# Patient Record
Sex: Male | Born: 1970 | Race: White | Hispanic: No | Marital: Married | Smoking: Former smoker
Health system: Southern US, Community
[De-identification: ages and names within clinical notes are randomized; demographics above are authoritative.]

## PROBLEM LIST (undated history)

## (undated) DIAGNOSIS — K219 Gastro-esophageal reflux disease without esophagitis: Secondary | ICD-10-CM

## (undated) DIAGNOSIS — I10 Essential (primary) hypertension: Secondary | ICD-10-CM

## (undated) HISTORY — PX: KNEE SURGERY: SHX244

---

## 2000-01-21 ENCOUNTER — Encounter: Admission: RE | Admit: 2000-01-21 | Discharge: 2000-01-21 | Payer: Self-pay | Admitting: Orthopaedic Surgery

## 2000-01-21 ENCOUNTER — Encounter: Payer: Self-pay | Admitting: Orthopaedic Surgery

## 2006-04-07 ENCOUNTER — Encounter: Admission: RE | Admit: 2006-04-07 | Discharge: 2006-04-07 | Payer: Self-pay | Admitting: Orthopaedic Surgery

## 2012-04-27 ENCOUNTER — Other Ambulatory Visit: Payer: Self-pay | Admitting: Family Medicine

## 2012-04-27 DIAGNOSIS — R110 Nausea: Secondary | ICD-10-CM

## 2012-04-27 DIAGNOSIS — R1011 Right upper quadrant pain: Secondary | ICD-10-CM

## 2012-04-28 ENCOUNTER — Ambulatory Visit
Admission: RE | Admit: 2012-04-28 | Discharge: 2012-04-28 | Disposition: A | Discharge status: Home / Self Care | Payer: Commercial Managed Care - PPO | Source: Ambulatory Visit | Attending: Family Medicine | Admitting: Family Medicine

## 2012-04-28 DIAGNOSIS — R110 Nausea: Secondary | ICD-10-CM

## 2012-04-28 DIAGNOSIS — R1011 Right upper quadrant pain: Secondary | ICD-10-CM

## 2012-05-18 ENCOUNTER — Other Ambulatory Visit: Payer: Self-pay | Admitting: Gastroenterology

## 2012-05-18 DIAGNOSIS — R110 Nausea: Secondary | ICD-10-CM

## 2012-05-18 DIAGNOSIS — R1013 Epigastric pain: Secondary | ICD-10-CM

## 2012-05-31 ENCOUNTER — Encounter (HOSPITAL_COMMUNITY)
Admission: RE | Admit: 2012-05-31 | Discharge: 2012-05-31 | Disposition: A | Discharge status: Home / Self Care | Payer: Commercial Managed Care - PPO | Source: Ambulatory Visit | Attending: Gastroenterology | Admitting: Gastroenterology

## 2012-05-31 DIAGNOSIS — R1013 Epigastric pain: Secondary | ICD-10-CM | POA: Insufficient documentation

## 2012-05-31 DIAGNOSIS — R110 Nausea: Secondary | ICD-10-CM

## 2012-05-31 MED ORDER — TECHNETIUM TC 99M MEBROFENIN IV KIT
5.0000 | PACK | Freq: Once | INTRAVENOUS | Status: AC | PRN
  Administered 2012-05-31: 5 via INTRAVENOUS

## 2012-06-01 ENCOUNTER — Other Ambulatory Visit: Payer: Self-pay | Admitting: Gastroenterology

## 2012-06-01 DIAGNOSIS — R109 Unspecified abdominal pain: Secondary | ICD-10-CM

## 2012-06-01 DIAGNOSIS — R110 Nausea: Secondary | ICD-10-CM

## 2012-06-10 ENCOUNTER — Ambulatory Visit
Admission: RE | Admit: 2012-06-10 | Discharge: 2012-06-10 | Disposition: A | Discharge status: Home / Self Care | Payer: Commercial Managed Care - PPO | Source: Ambulatory Visit | Attending: Gastroenterology | Admitting: Gastroenterology

## 2012-06-10 DIAGNOSIS — R109 Unspecified abdominal pain: Secondary | ICD-10-CM

## 2012-06-10 DIAGNOSIS — R110 Nausea: Secondary | ICD-10-CM

## 2012-06-10 MED ORDER — IOHEXOL 300 MG/ML  SOLN
125.0000 mL | Freq: Once | INTRAMUSCULAR | Status: AC | PRN
  Administered 2012-06-10: 125 mL via INTRAVENOUS

## 2013-01-08 IMAGING — NM NM HEPATO W/GB/PHARM/[PERSON_NAME]
3 series · 13 of 13 positions shown · non-contrast
Comparison: Ultrasound abdomen 04/28/2012.

CLINICAL DATA: Epigastric abdominal pain.

NUCLEAR MEDICINE HEPATOBILIARY IMAGING WITH GALLBLADDER EF
TECHNIQUE: Sequential images of the abdomen were obtained [DATE] minutes following intravenous administration of
radiopharmaceutical. After the oral intake of 8 ounces of Ensure
plus, gallbladder ejection fraction was determined.
Radiopharmaceutical:  5.0 mCi 4c-CCm Choletec

[he hepatobiliary · 1 of 1 slices shown (1 of 3)]
[im 1/1]
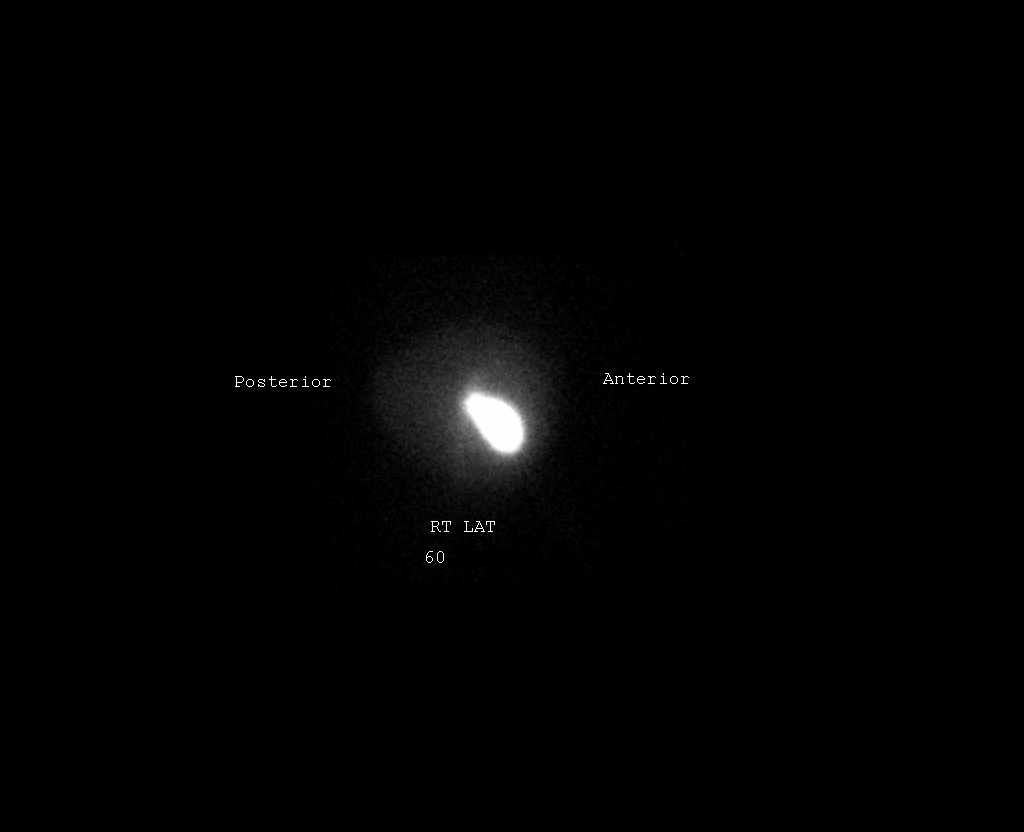

[he hepatobiliary · 3.43mm/px · 6 of 60 frames shown (2 of 3)]
[frame 6/60]
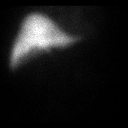
[frame 16/60]
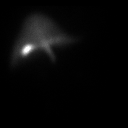
[frame 26/60]
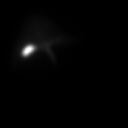
[frame 36/60]
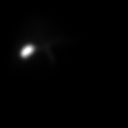
[frame 46/60]
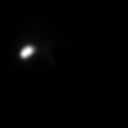
[frame 56/60]
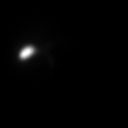

[he hepatobiliary · 3.43mm/px · 6 of 60 frames shown (3 of 3)]
[frame 6/60]
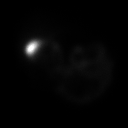
[frame 16/60]
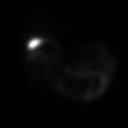
[frame 26/60]
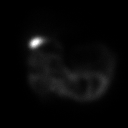
[frame 36/60]
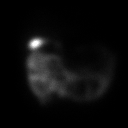
[frame 46/60]
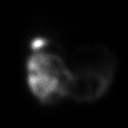
[frame 56/60]
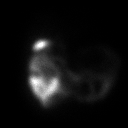

[13 of 13 positions shown; findings below may reference images not displayed]

FINDINGS: There is symmetric uptake in the liver and prompt
excretion into the biliary tree which is visualized by 10 minutes.
The gallbladder is visualized at 85minutes.  Activity is noted in
the small bowel after oral ingestion of Ensure.

The patient received 8 ounces of Ensure plus and a gallbladder
ejection fraction was estimated at 83%.  Normal is greater than
30%.

The patient did not experience symptoms during CCK infusion.
IMPRESSION: Normal biliary patency study and normal gallbladder ejection
fraction.

## 2014-06-29 ENCOUNTER — Encounter: Payer: Self-pay | Admitting: Neurology

## 2014-06-29 ENCOUNTER — Encounter: Payer: Self-pay | Admitting: *Deleted

## 2014-06-30 ENCOUNTER — Encounter: Payer: Self-pay | Admitting: Neurology

## 2014-06-30 ENCOUNTER — Ambulatory Visit (INDEPENDENT_AMBULATORY_CARE_PROVIDER_SITE_OTHER): Payer: Commercial Managed Care - PPO | Admitting: Neurology

## 2014-06-30 VITALS — BP 119/74 | HR 77 | Ht 77.0 in | Wt 290.0 lb

## 2014-06-30 DIAGNOSIS — G509 Disorder of trigeminal nerve, unspecified: Secondary | ICD-10-CM | POA: Insufficient documentation

## 2014-06-30 DIAGNOSIS — R4189 Other symptoms and signs involving cognitive functions and awareness: Secondary | ICD-10-CM | POA: Insufficient documentation

## 2014-06-30 DIAGNOSIS — G35 Multiple sclerosis: Secondary | ICD-10-CM

## 2014-06-30 DIAGNOSIS — M5481 Occipital neuralgia: Secondary | ICD-10-CM

## 2014-06-30 DIAGNOSIS — H539 Unspecified visual disturbance: Secondary | ICD-10-CM | POA: Insufficient documentation

## 2014-06-30 DIAGNOSIS — G500 Trigeminal neuralgia: Secondary | ICD-10-CM

## 2014-06-30 DIAGNOSIS — R4789 Other speech disturbances: Secondary | ICD-10-CM | POA: Insufficient documentation

## 2014-06-30 MED ORDER — CARBAMAZEPINE ER 200 MG PO TB12: 200.0000 mg | ORAL_TABLET | Freq: Two times a day (BID) | ORAL | Status: DC
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
## 2014-06-30 NOTE — Patient Instructions (Signed)
Overall you are doing fairly well but I do want to suggest a few things today:   Remember to drink plenty of fluid, eat healthy meals and do not skip any meals. Try to eat protein with a every meal and eat a healthy snack such as fruit or nuts in between meals. Try to keep a regular sleep-wake schedule and try to exercise daily, particularly in the form of walking, 20-30 minutes a day, if you can.   As far as your medications are concerned, I would like to suggest; tegretol 200mg  twice daily  As far as diagnostic testing: MRI of the brain, MRI of the cervical spine, labs  I would like to see you back in 3 months, sooner if we need to. Please call us with any interim questions, concerns, problems, updates or refill requests.   Please also call us for any test results so we can go over those with you on the phone.  My clinical assistant and will answer any of your questions and relay your messages to me and also relay most of my messages to you.   Our phone number is [phone]. We also have an after hours call service for urgent matters and there is a physician on-call for urgent questions. For any emergencies you know to call 911 or go to the nearest emergency room

## 2014-07-01 ENCOUNTER — Encounter: Payer: Self-pay | Admitting: Neurology

## 2014-07-01 LAB — COMPREHENSIVE METABOLIC PANEL
ALT: 19 IU/L (ref 0–44)
AST: 16 IU/L (ref 0–40)
Albumin/Globulin Ratio: 1.6 (ref 1.1–2.5)
Albumin: 4.5 g/dL (ref 3.5–5.5)
Alkaline Phosphatase: 50 IU/L (ref 39–117)
BUN/Creatinine Ratio: 16 (ref 9–20)
BUN: 19 mg/dL (ref 6–24)
CALCIUM: 9.3 mg/dL (ref 8.7–10.2)
CHLORIDE: 102 mmol/L (ref 97–108)
CO2: 24 mmol/L (ref 18–29)
Creatinine, Ser: 1.19 mg/dL (ref 0.76–1.27)
GFR calc Af Amer: 86 mL/min/{1.73_m2} (ref >59)
GFR calc non Af Amer: 74 mL/min/{1.73_m2} (ref >59)
GLUCOSE: 105 mg/dL — AB (ref 65–99)
Globulin, Total: 2.8 g/dL (ref 1.5–4.5)
POTASSIUM: 4.5 mmol/L (ref 3.5–5.2)
Sodium: 140 mmol/L (ref 134–144)
TOTAL PROTEIN: 7.3 g/dL (ref 6.0–8.5)
Total Bilirubin: 0.6 mg/dL (ref 0.0–1.2)

## 2014-07-01 LAB — C-REACTIVE PROTEIN: CRP: 1 mg/L (ref 0.0–4.9)

## 2014-07-01 LAB — SEDIMENTATION RATE: SED RATE: 8 mm/h (ref 0–15)

## 2014-07-06 ENCOUNTER — Ambulatory Visit
Admission: RE | Admit: 2014-07-06 | Discharge: 2014-07-06 | Disposition: A | Discharge status: Home / Self Care | Payer: Commercial Managed Care - PPO | Source: Ambulatory Visit | Attending: Neurology | Admitting: Neurology

## 2014-07-06 ENCOUNTER — Telehealth: Payer: Self-pay | Admitting: Neurology

## 2014-07-06 DIAGNOSIS — M5481 Occipital neuralgia: Secondary | ICD-10-CM

## 2014-07-06 DIAGNOSIS — G35 Multiple sclerosis: Secondary | ICD-10-CM

## 2014-07-06 DIAGNOSIS — G500 Trigeminal neuralgia: Secondary | ICD-10-CM

## 2014-07-06 DIAGNOSIS — R4789 Other speech disturbances: Secondary | ICD-10-CM

## 2014-07-06 DIAGNOSIS — H539 Unspecified visual disturbance: Secondary | ICD-10-CM

## 2014-07-06 MED ORDER — GADOBENATE DIMEGLUMINE 529 MG/ML IV SOLN: 20.0000 mL | Freq: Once | INTRAVENOUS | Status: AC | PRN

## 2014-07-06 NOTE — Telephone Encounter (Signed)
Patient calling to state that he would like to be called back as soon as his MRI results come in, wants to get everything sorted out before Thanksgiving, please return call and advise.

## 2014-07-07 NOTE — Telephone Encounter (Signed)
Jonathan Lawrence - Would you call and let patient know that it takes a few days for the neuroradiologists to review the hundreds of the images we took of his brain. I will call him as soon as I have the results. If I personally see anything concerning on his images in the meantime, I will definitely give him a call.

## 2014-07-10 ENCOUNTER — Other Ambulatory Visit: Payer: Self-pay | Admitting: Neurology

## 2014-07-10 MED ORDER — CARBAMAZEPINE ER 400 MG PO TB12: 400.0000 mg | ORAL_TABLET | Freq: Two times a day (BID) | ORAL | Status: DC

## 2014-07-10 NOTE — Telephone Encounter (Signed)
Patient is aware that we are waiting on results. Patient stated that he is still having the same issues and they are not getting any better. Patient is aware that once the results are in he would be getting a phone call back.

## 2014-07-10 NOTE — Telephone Encounter (Signed)
Spoke to patient. Will increase the Tegretol xr to 400mg  twice a day. He is still having the pain but tegretol is helping. Discussed his normal MRi of the brain and mild arthritic/degenerative changes on MRI of the neck. He will call back if the increased dose of the Tegretol doesn't work and we can try a different medication. Also discussed occipital and trigeminal nerve blocks but warned insurance may not pay for them however we could try.

## 2014-07-17 NOTE — Telephone Encounter (Signed)
Patient stated still experiencing headaches and Tegretol XR hasn't helped, has experienced nausea, very Lethargic, and knock's him completely out after taking medication.  Please call and advise.

## 2014-07-19 NOTE — Telephone Encounter (Signed)
Spoke to patient and he is aware that we do have his message and some one will give him a call back today. Patient says thank you for following up. Please advise

## 2014-07-19 NOTE — Telephone Encounter (Signed)
Pt calling back stating noone has called him back and he is now having pressure in his head and still feels nausea with headaches.  Please call and advise.

## 2014-07-19 NOTE — Telephone Encounter (Signed)
Jonathan Lawrence - Can you schedule this patient at noon tomorrow please for a follow up appointment? I'm including Shanno so she can creat the slot for me. Thank you

## 2014-07-20 ENCOUNTER — Ambulatory Visit (INDEPENDENT_AMBULATORY_CARE_PROVIDER_SITE_OTHER): Payer: Commercial Managed Care - PPO | Admitting: Neurology

## 2014-07-20 ENCOUNTER — Telehealth: Payer: Self-pay | Admitting: Neurology

## 2014-07-20 ENCOUNTER — Encounter: Payer: Self-pay | Admitting: Neurology

## 2014-07-20 VITALS — BP 133/91 | HR 69 | Ht 77.0 in | Wt 288.0 lb

## 2014-07-20 DIAGNOSIS — R509 Fever, unspecified: Secondary | ICD-10-CM

## 2014-07-20 DIAGNOSIS — M5481 Occipital neuralgia: Secondary | ICD-10-CM

## 2014-07-20 DIAGNOSIS — J029 Acute pharyngitis, unspecified: Secondary | ICD-10-CM

## 2014-07-20 DIAGNOSIS — J020 Streptococcal pharyngitis: Secondary | ICD-10-CM

## 2014-07-20 LAB — COMPREHENSIVE METABOLIC PANEL
ALBUMIN: 4.5 g/dL (ref 3.5–5.5)
ALT: 21 IU/L (ref 0–44)
AST: 18 IU/L (ref 0–40)
Albumin/Globulin Ratio: 1.6 (ref 1.1–2.5)
Alkaline Phosphatase: 78 IU/L (ref 39–117)
BUN/Creatinine Ratio: 11 (ref 9–20)
BUN: 12 mg/dL (ref 6–24)
CO2: 27 mmol/L (ref 18–29)
Calcium: 9 mg/dL (ref 8.7–10.2)
Chloride: 99 mmol/L (ref 96–108)
Creatinine, Ser: 1.11 mg/dL (ref 0.76–1.27)
GFR calc non Af Amer: 81 mL/min/{1.73_m2} (ref >59)
GFR, EST AFRICAN AMERICAN: 94 mL/min/{1.73_m2} (ref >59)
GLOBULIN, TOTAL: 2.9 g/dL (ref 1.5–4.5)
Glucose: 99 mg/dL (ref 65–99)
Potassium: 4.1 mmol/L (ref 3.5–5.2)
Sodium: 137 mmol/L (ref 134–144)
Total Bilirubin: 0.3 mg/dL (ref 0.0–1.2)
Total Protein: 7.4 g/dL (ref 6.0–8.5)

## 2014-07-20 LAB — CBC WITH DIFFERENTIAL
BASOS: 1 %
Basophils Absolute: 0.1 10*3/uL (ref 0.0–0.2)
EOS ABS: 0.2 10*3/uL (ref 0.0–0.4)
EOS: 4 %
HCT: 39.7 % (ref 37.5–51.0)
Hemoglobin: 14.1 g/dL (ref 12.6–17.7)
LYMPHS ABS: 1.1 10*3/uL (ref 0.7–3.1)
Lymphs: 25 %
MCH: 31.4 pg (ref 26.6–33.0)
MCHC: 35.5 g/dL (ref 31.5–35.7)
MCV: 88 fL (ref 79–97)
MONOS ABS: 0.6 10*3/uL (ref 0.1–0.9)
Monocytes: 14 %
Neutrophils Absolute: 2.4 10*3/uL (ref 1.4–7.0)
Neutrophils Relative %: 56 %
PLATELETS: 187 10*3/uL (ref 150–379)
RBC: 4.49 x10E6/uL (ref 4.14–5.80)
RDW: 14 % (ref 12.3–15.4)
WBC: 4.2 10*3/uL (ref 3.4–10.8)

## 2014-07-20 MED ORDER — AZITHROMYCIN 250 MG PO TABS: ORAL_TABLET | ORAL | Status: DC

## 2014-07-20 MED ORDER — GABAPENTIN 300 MG PO CAPS: 600.0000 mg | ORAL_CAPSULE | Freq: Three times a day (TID) | ORAL | Status: DC

## 2014-07-20 NOTE — Telephone Encounter (Signed)
Patient was not feeling well at appointment today, fever, sore throat. CMP/CBC were unremarkable. WBCs within normal limits.

## 2014-07-20 NOTE — Telephone Encounter (Signed)
Patient calling for blood work results.  Please call and advise. °

## 2014-07-23 DIAGNOSIS — M5481 Occipital neuralgia: Secondary | ICD-10-CM | POA: Insufficient documentation
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
## 2014-07-23 NOTE — Patient Instructions (Signed)
What to expect afterwards?  Immediately after the injection, the back of your head may feel warm and numb. You may also experience reduction in the pain. The local anaesthetic wears off in a few hours and the steroid usually takes 3-7 days to take effect.  The pain relief is variable and can last from a few days to several months. Some patients do not experience any pain relief. Hence it is difficult to predict the outcome of the injection treatment in a particular patient.  There may be some discomfort at the injection site for a couple of days after treatment, however, this should settle quite quickly. We advise you to take things easy for the rest of the day. Continue taking your pain medication as advised by your consultant or until you feel benefit from the treatment.  What are the side effects / complications?  Common  Soreness / bruising at the injection site.  Temporary increase (up to 7 days) in pain following procedure.  Rare  Bleeding  Infection at the injection site  Allergic reaction  New pain  Worsening pain

## 2014-07-24 ENCOUNTER — Encounter: Payer: Self-pay | Admitting: Neurology

## 2014-10-04 ENCOUNTER — Telehealth: Payer: Self-pay | Admitting: Neurology

## 2014-10-04 NOTE — Telephone Encounter (Signed)
Dr. Lucia Gaskins already called and talked with patient.

## 2014-10-04 NOTE — Telephone Encounter (Signed)
Pt is calling stating he wants some follow up he is going to be traveling and going diving and wants to make sure there will be no problems with his current situation.  Please call and advise.  Would like to talk with Dr. Lucia Gaskins.

## 2014-10-04 NOTE — Telephone Encounter (Signed)
Spoke to this very nice patient, he is doing better. He is going on vacation, going scuba diving. I don't see any issues exceot possibly the scuba mask causing discomfort in the occipital area. Recommended he take neurontin while on vacation and cal for any exacerbation and we can discuss otp.

## 2014-10-26 ENCOUNTER — Emergency Department (HOSPITAL_COMMUNITY)
Admission: EM | Admit: 2014-10-26 | Discharge: 2014-10-26 | Disposition: A | Discharge status: Home / Self Care | Payer: Commercial Managed Care - PPO | Attending: Emergency Medicine | Admitting: Emergency Medicine

## 2014-10-26 ENCOUNTER — Emergency Department (HOSPITAL_COMMUNITY): Payer: Commercial Managed Care - PPO

## 2014-10-26 ENCOUNTER — Encounter (HOSPITAL_COMMUNITY): Payer: Self-pay

## 2014-10-26 DIAGNOSIS — R0602 Shortness of breath: Secondary | ICD-10-CM | POA: Insufficient documentation

## 2014-10-26 DIAGNOSIS — K219 Gastro-esophageal reflux disease without esophagitis: Secondary | ICD-10-CM | POA: Diagnosis not present

## 2014-10-26 DIAGNOSIS — Z87891 Personal history of nicotine dependence: Secondary | ICD-10-CM | POA: Diagnosis not present

## 2014-10-26 DIAGNOSIS — Z880 Allergy status to penicillin: Secondary | ICD-10-CM | POA: Insufficient documentation

## 2014-10-26 DIAGNOSIS — R0789 Other chest pain: Secondary | ICD-10-CM | POA: Diagnosis not present

## 2014-10-26 DIAGNOSIS — I10 Essential (primary) hypertension: Secondary | ICD-10-CM | POA: Insufficient documentation

## 2014-10-26 DIAGNOSIS — R079 Chest pain, unspecified: Secondary | ICD-10-CM | POA: Diagnosis present

## 2014-10-26 HISTORY — DX: Gastro-esophageal reflux disease without esophagitis: K21.9

## 2014-10-26 HISTORY — DX: Essential (primary) hypertension: I10

## 2014-10-26 LAB — I-STAT TROPONIN, ED
TROPONIN I, POC: 0 ng/mL (ref 0.00–0.08)
Troponin i, poc: 0 ng/mL (ref 0.00–0.08)

## 2014-10-26 LAB — CBC
HCT: 41.7 % (ref 39.0–52.0)
Hemoglobin: 14.5 g/dL (ref 13.0–17.0)
MCH: 32 pg (ref 26.0–34.0)
MCHC: 34.8 g/dL (ref 30.0–36.0)
MCV: 92.1 fL (ref 78.0–100.0)
Platelets: 279 10*3/uL (ref 150–400)
RBC: 4.53 MIL/uL (ref 4.22–5.81)
RDW: 13.3 % (ref 11.5–15.5)
WBC: 5.2 10*3/uL (ref 4.0–10.5)

## 2014-10-26 LAB — COMPREHENSIVE METABOLIC PANEL
ALBUMIN: 3.8 g/dL (ref 3.5–5.2)
ALT: 22 U/L (ref 0–53)
ANION GAP: 6 (ref 5–15)
AST: 23 U/L (ref 0–37)
Alkaline Phosphatase: 42 U/L (ref 39–117)
BUN: 10 mg/dL (ref 6–23)
CALCIUM: 9 mg/dL (ref 8.4–10.5)
CHLORIDE: 105 mmol/L (ref 96–112)
CO2: 28 mmol/L (ref 19–32)
Creatinine, Ser: 1.03 mg/dL (ref 0.50–1.35)
GFR calc Af Amer: >90 mL/min (ref >90)
GFR calc non Af Amer: 87 mL/min — ABNORMAL LOW (ref >90)
Glucose, Bld: 98 mg/dL (ref 70–99)
Potassium: 4.1 mmol/L (ref 3.5–5.1)
Sodium: 139 mmol/L (ref 135–145)
Total Bilirubin: 1 mg/dL (ref 0.3–1.2)
Total Protein: 7 g/dL (ref 6.0–8.3)

## 2014-10-26 LAB — BRAIN NATRIURETIC PEPTIDE: B NATRIURETIC PEPTIDE 5: 26.5 pg/mL (ref 0.0–100.0)

## 2014-10-26 LAB — D-DIMER, QUANTITATIVE (NOT AT ARMC)

## 2014-10-26 MED ORDER — CYCLOBENZAPRINE HCL 10 MG PO TABS: 10.0000 mg | ORAL_TABLET | Freq: Two times a day (BID) | ORAL | Status: DC | PRN

## 2014-10-26 NOTE — ED Notes (Signed)
NAD noted. Pt denies any other complaints. Pt refused wheelchair on discharge. Pt ambulatory on discharge.

## 2014-10-26 NOTE — ED Notes (Signed)
Jonathan Lawrence Charity fundraiser at bedside

## 2014-10-26 NOTE — Discharge Instructions (Signed)
Take Flexeril as needed for pain. Refer to attached documents for more information. Follow up with your doctor for further evaluation. Return to the ED with worsening or concerning symptoms.

## 2014-10-26 NOTE — ED Provider Notes (Signed)
CSN: [id number]     Arrival date & time 10/26/14  7829 History   First MD Initiated Contact with Patient 10/26/14 [phone]     Chief Complaint  Patient presents with  . Chest Pain  . Shortness of Breath     (Consider location/radiation/quality/duration/timing/severity/associated sxs/prior Treatment) Patient is a 44 y.o. male presenting with chest pain. The history is provided by the patient. No language interpreter was used.  Chest Pain Pain location:  R chest Pain quality: sharp   Pain radiates to:  Does not radiate Pain radiates to the back: no   Pain severity:  Severe Onset quality:  Sudden Duration:  6 hours Timing:  Constant Progression:  Improving Chronicity:  New Context: breathing and at rest   Context: no drug use, not eating, no intercourse, no movement, not raising an arm, no stress and no trauma   Relieved by:  Nothing Worsened by:  Nothing tried Ineffective treatments:  None tried Associated symptoms: shortness of breath   Associated symptoms: no abdominal pain, no dizziness, no dysphagia, no fatigue, no fever, no nausea, no palpitations, not vomiting and no weakness   Shortness of breath:    Severity:  Moderate   Onset quality:  Gradual   Duration:  6 hours   Timing:  Constant   Progression:  Improving Risk factors: male sex   Risk factors: no aortic disease, no birth control, no coronary artery disease, no diabetes mellitus, no Ehlers-Danlos syndrome, no high cholesterol, no hypertension, no immobilization, no Marfan's syndrome, not obese, not pregnant, no prior DVT/PE, no smoking and no surgery     Past Medical History  Diagnosis Date  . Hypertension   . GERD (gastroesophageal reflux disease)    Past Surgical History  Procedure Laterality Date  . Knee surgery     Family History  Problem Relation Age of Onset  . Hypertension Father   . Hearing loss Father   . Heart disease    . Migraines Neg Hx    History  Substance Use Topics  . Smoking status:  Former Games developer  . Smokeless tobacco: Never Used  . Alcohol Use: 0.0 oz/week    0 Standard drinks or equivalent per week     Comment: 2 drinks daily     Review of Systems  Constitutional: Negative for fever, chills and fatigue.  HENT: Negative for trouble swallowing.   Eyes: Negative for visual disturbance.  Respiratory: Positive for shortness of breath.   Cardiovascular: Positive for chest pain. Negative for palpitations.  Gastrointestinal: Negative for nausea, vomiting, abdominal pain and diarrhea.  Genitourinary: Negative for dysuria and difficulty urinating.  Musculoskeletal: Negative for arthralgias and neck pain.  Skin: Negative for color change.  Neurological: Negative for dizziness and weakness.  Psychiatric/Behavioral: Negative for dysphoric mood.      Allergies  Penicillins; Hydrocodone; and Oxycodone  Home Medications   Prior to Admission medications   Medication Sig Start Date End Date Taking? Authorizing Provider  esomeprazole (NEXIUM) 40 MG capsule Take 40 mg by mouth daily at 12 noon.   Yes Historical Provider, MD  gabapentin (NEURONTIN) 300 MG capsule Take 2 capsules (600 mg total) by mouth 3 (three) times daily. Patient taking differently: Take 600 mg by mouth daily as needed (neuralgia).  07/20/14  Yes Anson Fret, MD  valACYclovir (VALTREX) 500 MG tablet Take 500 mg by mouth daily as needed (cold sores).  10/24/14  Yes Historical Provider, MD  azithromycin (ZITHROMAX Z-PAK) 250 MG tablet Day 1: take 2 Days  2-5: take one daily Patient not taking: Reported on 10/26/2014 07/20/14   Anson Fret, MD   BP 126/78 mmHg  Temp(Src) 97.6 F (36.4 C) (Oral)  Resp 12  Ht 6\' 4"  (1.93 m)  Wt 270 lb (122.471 kg)  BMI 32.88 kg/m2 Physical Exam  Constitutional: He is oriented to person, place, and time. He appears well-developed and well-nourished. No distress.  HENT:  Head: Normocephalic and atraumatic.  Eyes: Conjunctivae and EOM are normal.  Neck: Normal range of  motion.  Cardiovascular: Normal rate and regular rhythm.  Exam reveals no gallop and no friction rub.   No murmur heard. Pulmonary/Chest: Effort normal and breath sounds normal. He has no wheezes. He has no rales. He exhibits no tenderness.  Abdominal: Soft. He exhibits no distension. There is no tenderness. There is no rebound.  Musculoskeletal: Normal range of motion.  Neurological: He is alert and oriented to person, place, and time. Coordination normal.  Speech is goal-oriented. Moves limbs without ataxia.   Skin: Skin is warm and dry.  Psychiatric: He has a normal mood and affect. His behavior is normal.  Nursing note and vitals reviewed.   ED Course  Procedures (including critical care time) Labs Review Labs Reviewed  COMPREHENSIVE METABOLIC PANEL - Abnormal; Notable for the following:    GFR calc non Af Amer 87 (*)    All other components within normal limits  CBC  BRAIN NATRIURETIC PEPTIDE  D-DIMER, QUANTITATIVE  I-STAT TROPOININ, ED  Rosezena Sensor, ED    Imaging Review Dg Chest Port 1 View  10/26/2014   CLINICAL DATA:  Chest pain intermittently for 2 weeks. Acute exacerbation of chest pain today. Shortness of breath. Initial encounter.  EXAM: PORTABLE CHEST - 1 VIEW  COMPARISON:  None.  FINDINGS: Cardiopericardial silhouette within normal limits. Mediastinal contours normal. Trachea midline. No airspace disease or effusion. Monitoring leads project over the chest.  IMPRESSION: No active disease.   Electronically Signed   By: Andreas Newport M.D.   On: 10/26/2014 07:20     EKG Interpretation   Date/Time:  Thursday October 26 2014 07:04:37 EST Ventricular Rate:  68 PR Interval:  170 QRS Duration: 92 QT Interval:  401 QTC Calculation: 426 R Axis:   17 Text Interpretation:  Sinus rhythm Normal ECG No old tracing to compare  Confirmed by OTTER  MD, OLGA ([id number]) on 10/26/2014 7:08:51 AM      MDM   Final diagnoses:  Atypical chest pain    8:52 AM Labs pending.  Chest xray shows no acute changes. EKG shows no acute changes. Vitals stable and patient afebrile.   12:33 PM Troponin negative. Patient having atypical chest pain. No concern for ischemic etiology. Patient will be discharged with out further evaluation. Patient instructed to follow up with PCP. Vitals stable and patient afebrile.   Emilia Beck, PA-C 10/26/14 1618  Derwood Kaplan, MD 10/28/14 1644

## 2014-10-26 NOTE — ED Notes (Signed)
X-ray at bedside

## 2016-09-29 ENCOUNTER — Encounter (INDEPENDENT_AMBULATORY_CARE_PROVIDER_SITE_OTHER): Payer: Self-pay | Admitting: Orthopaedic Surgery

## 2016-09-29 ENCOUNTER — Ambulatory Visit (INDEPENDENT_AMBULATORY_CARE_PROVIDER_SITE_OTHER): Payer: Commercial Managed Care - PPO | Admitting: Orthopaedic Surgery

## 2016-09-29 ENCOUNTER — Ambulatory Visit (INDEPENDENT_AMBULATORY_CARE_PROVIDER_SITE_OTHER): Payer: Commercial Managed Care - PPO

## 2016-09-29 VITALS — BP 124/88 | HR 84 | Resp 14 | Ht 76.0 in | Wt 270.0 lb

## 2016-09-29 DIAGNOSIS — M79672 Pain in left foot: Secondary | ICD-10-CM

## 2016-09-29 DIAGNOSIS — M79671 Pain in right foot: Secondary | ICD-10-CM

## 2016-09-29 MED ORDER — LIDOCAINE HCL 1 % IJ SOLN
1.0000 mL | INTRAMUSCULAR | Status: AC | PRN
  Administered 2016-09-29: 1 mL

## 2016-09-29 MED ORDER — BUPIVACAINE HCL 0.5 % IJ SOLN
1.0000 mL | INTRAMUSCULAR | Status: AC | PRN
  Administered 2016-09-29: 1 mL

## 2016-09-29 MED ORDER — METHYLPREDNISOLONE ACETATE 40 MG/ML IJ SUSP
40.0000 mg | INTRAMUSCULAR | Status: AC | PRN
  Administered 2016-09-29: 40 mg

## 2016-09-29 NOTE — Progress Notes (Signed)
   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   Orders:  No orders of the defined types were placed in this encounter.  No orders of the defined types were placed in this encounter.     Procedures: Foot Inj Date/Time: 09/29/2016 4:36 PM Performed by: Valeria Batman Authorized by: Valeria Batman   Consent Given by:  Patient Site marked: the procedure site was marked   Indications:  Pain Condition comment:  Mid foot arthritis Approach:  Dorsal Medications:  1 mL lidocaine 1 %; 1 mL bupivacaine 0.5 %; 40 mg methylPREDNISolone acetate 40 MG/ML Patient Tolerance:  Patient tolerated the procedure well with no immediate complications     Clinical Data: No additional findings.   Subjective: No chief complaint on file. Jonathan Lawrence relates chronic problems with both of his feet more so on the right than the left. He is aware that he has pronated feet and has been to the good foot store for inserts. He is not sure they've made much of a difference. He is on his feet for many hours during the day and is having more and more trouble again more on the right than the left .Marland Kitchen No significant swelling.  denies any problem with the skin  HPI  Review of Systems   Objective: Vital Signs: There were no vitals taken for this visit.  Physical Exam  Ortho Exam emanation of both feet reveal significant pronation bilaterally more so on the right than the left. There are large areas of osteophyte formation at the first metatarsal  navicular joint on the right and minimally on the left. Good pulses skin intact neurologically intact. No ankle pain.  Specialty Comments:  No specialty comments available.  Imaging: No results found.   PMFS History: Patient Active Problem List   Diagnosis Date Noted  . Occipital neuralgia of right side 07/23/2014  . 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 Word finding difficulty 06/30/2014  . Vision changes 06/30/2014  . Cognitive changes 06/30/2014   Past Medical History:  Diagnosis Date  . GERD (gastroesophageal reflux disease)   . Hypertension     Family History  Problem Relation Age of Onset  . Hypertension Father   . Hearing loss Father   . Heart disease    . Migraines Neg Hx     Past Surgical History:  Procedure Laterality Date  . KNEE SURGERY     Social History   Occupational History  . Not on file.   Social History Main Topics  . Smoking status: Former Games developer  . Smokeless tobacco: Never Used  . Alcohol use 0.0 oz/week     Comment: 2 drinks daily   . Drug use: No  . Sexual activity: Not on file

## 2019-05-24 ENCOUNTER — Encounter (HOSPITAL_BASED_OUTPATIENT_CLINIC_OR_DEPARTMENT_OTHER): Payer: Self-pay | Admitting: Emergency Medicine

## 2019-05-24 ENCOUNTER — Emergency Department (HOSPITAL_BASED_OUTPATIENT_CLINIC_OR_DEPARTMENT_OTHER): Payer: Commercial Managed Care - PPO

## 2019-05-24 ENCOUNTER — Other Ambulatory Visit: Payer: Self-pay

## 2019-05-24 ENCOUNTER — Emergency Department (HOSPITAL_BASED_OUTPATIENT_CLINIC_OR_DEPARTMENT_OTHER)
Admission: EM | Admit: 2019-05-24 | Discharge: 2019-05-24 | Disposition: A | Discharge status: Home / Self Care | Payer: Commercial Managed Care - PPO | Attending: Emergency Medicine | Admitting: Emergency Medicine

## 2019-05-24 DIAGNOSIS — Z87891 Personal history of nicotine dependence: Secondary | ICD-10-CM | POA: Insufficient documentation

## 2019-05-24 DIAGNOSIS — R1011 Right upper quadrant pain: Secondary | ICD-10-CM | POA: Diagnosis not present

## 2019-05-24 DIAGNOSIS — Z880 Allergy status to penicillin: Secondary | ICD-10-CM | POA: Diagnosis not present

## 2019-05-24 DIAGNOSIS — Z885 Allergy status to narcotic agent status: Secondary | ICD-10-CM | POA: Insufficient documentation

## 2019-05-24 DIAGNOSIS — I10 Essential (primary) hypertension: Secondary | ICD-10-CM | POA: Insufficient documentation

## 2019-05-24 DIAGNOSIS — Z79899 Other long term (current) drug therapy: Secondary | ICD-10-CM | POA: Insufficient documentation

## 2019-05-24 LAB — COMPREHENSIVE METABOLIC PANEL
ALT: 18 U/L (ref 0–44)
AST: 20 U/L (ref 15–41)
Albumin: 4.1 g/dL (ref 3.5–5.0)
Alkaline Phosphatase: 56 U/L (ref 38–126)
Anion gap: 9 (ref 5–15)
BUN: 11 mg/dL (ref 6–20)
CO2: 24 mmol/L (ref 22–32)
Calcium: 8.8 mg/dL — ABNORMAL LOW (ref 8.9–10.3)
Chloride: 101 mmol/L (ref 98–111)
Creatinine, Ser: 1.17 mg/dL (ref 0.61–1.24)
GFR calc Af Amer: >60 mL/min (ref >60)
GFR calc non Af Amer: >60 mL/min (ref >60)
Glucose, Bld: 98 mg/dL (ref 70–99)
Potassium: 3.8 mmol/L (ref 3.5–5.1)
Sodium: 134 mmol/L — ABNORMAL LOW (ref 135–145)
Total Bilirubin: 0.9 mg/dL (ref 0.3–1.2)
Total Protein: 7.4 g/dL (ref 6.5–8.1)

## 2019-05-24 LAB — CBC
HCT: 42.2 % (ref 39.0–52.0)
Hemoglobin: 14.1 g/dL (ref 13.0–17.0)
MCH: 31 pg (ref 26.0–34.0)
MCHC: 33.4 g/dL (ref 30.0–36.0)
MCV: 92.7 fL (ref 80.0–100.0)
Platelets: 271 10*3/uL (ref 150–400)
RBC: 4.55 MIL/uL (ref 4.22–5.81)
RDW: 12.9 % (ref 11.5–15.5)
WBC: 5.4 10*3/uL (ref 4.0–10.5)
nRBC: 0 % (ref 0.0–0.2)

## 2019-05-24 LAB — URINALYSIS, ROUTINE W REFLEX MICROSCOPIC
Bilirubin Urine: NEGATIVE
Glucose, UA: NEGATIVE mg/dL
Hgb urine dipstick: NEGATIVE
Ketones, ur: NEGATIVE mg/dL
Leukocytes,Ua: NEGATIVE
Nitrite: NEGATIVE
Protein, ur: NEGATIVE mg/dL
Specific Gravity, Urine: <1.005 — ABNORMAL LOW (ref 1.005–1.030)
pH: 6.5 (ref 5.0–8.0)

## 2019-05-24 LAB — LIPASE, BLOOD: Lipase: 28 U/L (ref 11–51)

## 2019-05-24 MED ORDER — SODIUM CHLORIDE 0.9 % IV SOLN: 1000.0000 mL | INTRAVENOUS | Status: DC

## 2019-05-24 MED ORDER — DICYCLOMINE HCL 20 MG PO TABS: 20.0000 mg | ORAL_TABLET | Freq: Three times a day (TID) | ORAL | 0 refills | Status: DC | PRN

## 2019-05-24 MED ORDER — ONDANSETRON HCL 4 MG/2ML IJ SOLN
4.0000 mg | Freq: Once | INTRAMUSCULAR | Status: AC
  Administered 2019-05-24: 4 mg via INTRAVENOUS
  Filled 2019-05-24: qty 2

## 2019-05-24 MED ORDER — SODIUM CHLORIDE 0.9 % IV BOLUS (SEPSIS)
1000.0000 mL | Freq: Once | INTRAVENOUS | Status: AC
  Administered 2019-05-24: 16:00:00 1000 mL via INTRAVENOUS

## 2019-05-24 MED ORDER — FENTANYL CITRATE (PF) 100 MCG/2ML IJ SOLN
75.0000 ug | Freq: Once | INTRAMUSCULAR | Status: AC
  Administered 2019-05-24: 75 ug via INTRAVENOUS
  Filled 2019-05-24: qty 2

## 2019-05-24 NOTE — ED Triage Notes (Signed)
RUQ pain since last night. Hx of gallbladder issues.

## 2019-05-24 NOTE — Discharge Instructions (Signed)
Follow-up with a GI doctor for further evaluation, return as needed for worsening symptoms

## 2019-05-24 NOTE — ED Notes (Signed)
ED Provider at bedside. 

## 2019-05-24 NOTE — ED Notes (Signed)
Pt. Reports he ate salad for lunch that poss. Brought this on that is when this started.  Pt. Then had burger for lunch today making things much worse he reports.    Pt. Reports he is in so much pain.

## 2019-05-24 NOTE — ED Provider Notes (Signed)
[facility] EMERGENCY DEPARTMENT Provider Note   CSN: [id number] Arrival date & time: 05/24/19  1428     History   Chief Complaint Chief Complaint  Patient presents with  . Abdominal Pain    HPI Jonathan Lawrence is a 48 y.o. male.     The history is provided by the patient.  Abdominal Pain Pain location:  RUQ Pain quality: aching   Pain quality comment:  Also feels like it is swollen in the RUQ Pain radiates to:  Chest Pain severity:  Severe Onset quality:  Gradual Duration:  1 day (has had some episodes over the last 3-4 months) Timing:  Constant Progression:  Worsening Chronicity:  Recurrent Context comment:  Starting after eating last night, over the last few months certain meals will make it worse Worsened by:  Position changes (eating left over hamburger for lunch) Associated symptoms: no fever, no nausea and no vomiting     Past Medical History:  Diagnosis Date  . GERD (gastroesophageal reflux disease)   . Hypertension     Patient Active Problem List   Diagnosis Date Noted  . Occipital neuralgia of right side 07/23/2014  . 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 Word finding difficulty 06/30/2014  . Vision changes 06/30/2014  . Cognitive changes 06/30/2014    Past Surgical History:  Procedure Laterality Date  . KNEE SURGERY          Home Medications    Prior to Admission medications   Medication Sig Start Date End Date Taking? Authorizing Provider  azithromycin (ZITHROMAX Z-PAK) 250 MG tablet Day 1: take 2 Days 2-5: take one daily Patient not taking: Reported on 10/26/2014 07/20/14   Anson Fret, MD  cyclobenzaprine (FLEXERIL) 10 MG tablet Take 1 tablet (10 mg total) by mouth 2 (two) times daily as needed for muscle spasms. Patient not taking: Reported on 09/29/2016 10/26/14   Emilia Beck, PA-C  dicyclomine (BENTYL) 20 MG tablet Take 1 tablet (20 mg total) by mouth 3 (three) times daily as needed for spasms. 05/24/19   Linwood Dibbles, MD  esomeprazole (NEXIUM) 40 MG capsule Take 40 mg by mouth daily at 12 noon.    [provider]  gabapentin (NEURONTIN) 300 MG capsule Take 2 capsules (600 mg total) by mouth 3 (three) times daily. Patient not taking: Reported on 09/29/2016 07/20/14   Anson Fret, MD  valACYclovir (VALTREX) 500 MG tablet Take 500 mg by mouth daily as needed (cold sores).  10/24/14   [provider]    Family History Family History  Problem Relation Age of Onset  . Hypertension Father   . Hearing loss Father   . Heart disease Other   . Migraines Neg Hx     Social History Social History   Tobacco Use  . Smoking status: Former Games developer  . Smokeless tobacco: Never Used  Substance Use Topics  . Alcohol use: Yes    Alcohol/week: 0.0 standard drinks    Comment: 2 drinks daily   . Drug use: No     Allergies   Penicillins, Hydrocodone, and Oxycodone   Review of Systems Review of Systems  Constitutional: Negative for fever.  Gastrointestinal: Positive for abdominal pain. Negative for nausea and vomiting.  All other systems reviewed and are negative.    Physical Exam Updated Vital Signs BP (!) 137/91 (BP Location: Right Arm)   Pulse 66   Temp 98.1 F (36.7 C) (Oral)   Resp 18   Ht 1.956  m (6\' 5" )   Wt 131.5 kg   SpO2 98%   BMI 34.39 kg/m   Physical Exam Vitals signs and nursing note reviewed.  Constitutional:      General: He is not in acute distress.    Appearance: He is well-developed.  HENT:     Head: Normocephalic and atraumatic.     Right Ear: External ear normal.     Left Ear: External ear normal.  Eyes:     General: No scleral icterus.       Right eye: No discharge.        Left eye: No discharge.     Conjunctiva/sclera: Conjunctivae normal.  Neck:     Musculoskeletal: Neck supple.     Trachea: No tracheal deviation.  Cardiovascular:     Rate and Rhythm: Normal rate and regular rhythm.  Pulmonary:     Effort: Pulmonary effort is normal. No  respiratory distress.     Breath sounds: Normal breath sounds. No stridor. No wheezing or rales.  Abdominal:     General: Bowel sounds are normal. There is no distension.     Palpations: Abdomen is soft.     Tenderness: There is abdominal tenderness in the right upper quadrant. There is no guarding or rebound.  Musculoskeletal:        General: No tenderness.  Skin:    General: Skin is warm and dry.     Findings: No rash.  Neurological:     Mental Status: He is alert.     Cranial Nerves: No cranial nerve deficit (no facial droop, extraocular movements intact, no slurred speech).     Sensory: No sensory deficit.     Motor: No abnormal muscle tone or seizure activity.     Coordination: Coordination normal.      ED Treatments / Results  Labs (all labs ordered are listed, but only abnormal results are displayed) Labs Reviewed  COMPREHENSIVE METABOLIC PANEL - Abnormal; Notable for the following components:      Result Value   Sodium 134 (*)    Calcium 8.8 (*)    All other components within normal limits  URINALYSIS, ROUTINE W REFLEX MICROSCOPIC - Abnormal; Notable for the following components:   Specific Gravity, Urine <1.005 (*)    All other components within normal limits  LIPASE, BLOOD  CBC    EKG None  Radiology US Abdomen Complete  Result Date: 05/24/2019 CLINICAL DATA:  Severe right upper quadrant pain for 1 day EXAM: ABDOMEN ULTRASOUND COMPLETE COMPARISON:  April 28, 2012 FINDINGS: Gallbladder: Gallbladder is not well distended. No sonographic Eulah Pont sign is reported by the ultrasonographer but the patient has had pain medications. No pericholecystic fluid is identified. No gallstones noted. Common bile duct: Diameter: 2.7 mm Liver: No focal lesion identified. Within normal limits in parenchymal echogenicity. Portal vein is patent on color Doppler imaging with normal direction of blood flow towards the liver. IVC: Not well seen due to overlying bowel gas. Pancreas: Not  well seen due to overlying bowel gas. Spleen: Size and appearance within normal limits. Right Kidney: Length: 12.4 cm. Echogenicity within normal limits. No mass or hydronephrosis visualized. Left Kidney: Length: 12 cm. Echogenicity within normal limits. No mass or hydronephrosis visualized. Abdominal aorta: No aneurysm visualized. Other findings: None. IMPRESSION: No acute abnormality. Electronically Signed   By: Sherian Rein M.D.   On: 05/24/2019 17:56    Procedures Procedures (including critical care time)  Medications Ordered in ED Medications  sodium chloride 0.9 %  bolus 1,000 mL (1,000 mLs Intravenous New Bag/Given 05/24/19 1625)    Followed by  0.9 %  sodium chloride infusion (has no administration in time range)  fentaNYL (SUBLIMAZE) injection 75 mcg (75 mcg Intravenous Given 05/24/19 1627)  ondansetron (ZOFRAN) injection 4 mg (4 mg Intravenous Given 05/24/19 1625)     Initial Impression / Assessment and Plan / ED Course  I have reviewed the triage vital signs and the nursing notes.  Pertinent labs & imaging results that were available during my care of the patient were reviewed by me and considered in my medical decision making (see chart for details).   Patient presented to the emergency room for evaluation of right upper quadrant abdominal pain.  Patient has noticed these episodes with certain foods.  Symptoms were concerning for the possibility of biliary colic.  However his laboratory tests and ultrasound are normal.  No evidence of aneurysm noted on ultrasound.  Labs do not suggest pancreatitis.  White blood cell count is not elevated argues against any acute infectious etiology.  At this time no signs of any acute emergency medical condition.  I will have him try Bentyl in addition to his antacids that he takes regularly.  I think the patient would benefit from outpatient GI follow-up.  Final Clinical Impressions(s) / ED Diagnoses   Final diagnoses:  Right upper quadrant  abdominal pain    ED Discharge Orders         Ordered    dicyclomine (BENTYL) 20 MG tablet  3 times daily PRN     05/24/19 1829           Dorie Rank, MD 05/24/19 [phone]

## 2019-05-26 ENCOUNTER — Other Ambulatory Visit: Payer: Self-pay | Admitting: Gastroenterology

## 2019-05-26 DIAGNOSIS — R1011 Right upper quadrant pain: Secondary | ICD-10-CM

## 2019-06-03 ENCOUNTER — Ambulatory Visit (HOSPITAL_COMMUNITY)
Admission: RE | Admit: 2019-06-03 | Discharge: 2019-06-03 | Disposition: A | Discharge status: Home / Self Care | Payer: Commercial Managed Care - PPO | Source: Ambulatory Visit | Attending: Gastroenterology | Admitting: Gastroenterology

## 2019-06-03 ENCOUNTER — Other Ambulatory Visit: Payer: Self-pay

## 2019-06-03 DIAGNOSIS — R1011 Right upper quadrant pain: Secondary | ICD-10-CM | POA: Insufficient documentation

## 2019-06-03 MED ORDER — TECHNETIUM TC 99M MEBROFENIN IV KIT
5.4500 | PACK | Freq: Once | INTRAVENOUS | Status: AC | PRN
  Administered 2019-06-03: 12:00:00 5.45 via INTRAVENOUS

## 2019-06-20 ENCOUNTER — Other Ambulatory Visit: Payer: Self-pay | Admitting: Surgery

## 2020-10-17 ENCOUNTER — Institutional Professional Consult (permissible substitution): Payer: Commercial Managed Care - PPO | Admitting: Pulmonary Disease

## 2020-10-30 ENCOUNTER — Encounter: Payer: Self-pay | Admitting: Pulmonary Disease

## 2020-10-30 ENCOUNTER — Ambulatory Visit: Payer: Commercial Managed Care - PPO | Admitting: Pulmonary Disease

## 2020-10-30 ENCOUNTER — Other Ambulatory Visit: Payer: Self-pay

## 2020-10-30 VITALS — BP 120/82 | HR 71 | Temp 97.6°F | Ht 77.0 in | Wt 285.2 lb

## 2020-10-30 DIAGNOSIS — G4733 Obstructive sleep apnea (adult) (pediatric): Secondary | ICD-10-CM

## 2020-10-30 NOTE — Progress Notes (Signed)
Jonathan Lawrence    [id number]    11-09-1970  Primary Care Physician:[place]  Referring Physician: Roderick Pee, PA [address],  [place] [address]  Chief complaint:   Patient with a history of obstructive sleep apnea Uses an oral device Had an episode of high blood pressure and vertigo about 2 to 4 weeks ago  HPI:  Episode raised concerns about his sleep disordered breathing He was having significant symptoms of snoring, witnessed apneas by his spouse He did have a recording that led to an oral device been fashioned for him  Since he has been using oral device Is no longer snoring Has been restorative sleep Good energy levels with no concerns  He did have borderline blood pressures but On the fateful night did have uncontrolled hypertension and vertigo  Usually goes to bed between 8 and 10 PM takes about 50 minutes to fall asleep 2-3 awakenings Final wake up time 4:45 AM  Weight has been relatively stable up 10 pounds in the last few years  He does wear a size 171/2-18 neck size shirt   Outpatient Encounter Medications as of 10/30/2020  Medication Sig  . amLODipine (NORVASC) 5 MG tablet Take 1 tablet by mouth daily.  Marland Kitchen aspirin 325 MG tablet Take by mouth.  . dicyclomine (BENTYL) 10 MG/5ML solution   . esomeprazole (NEXIUM) 40 MG capsule Take 40 mg by mouth daily at 12 noon.  . meclizine (ANTIVERT) 25 MG tablet Take 25 mg by mouth 3 (three) times daily as needed.  . Multiple Vitamins-Minerals (MULTIVITAMIN ADULT) CHEW Chew by mouth.  . pantoprazole (PROTONIX) 20 MG tablet Take 1 tablet by mouth daily.  Marland Kitchen zinc gluconate 50 MG tablet Take by mouth.  . loratadine (CLARITIN) 10 MG tablet Take by mouth. (Patient not taking: Reported on 10/30/2020)   No facility-administered encounter medications on file as of 10/30/2020.    Allergies as of 10/30/2020 - Review Complete 10/30/2020  Allergen Reaction Noted   . Penicillins Rash 05/31/2012  . Hydrocodone Nausea And Vomiting 10/26/2014  . Oxycodone Nausea And Vomiting 10/26/2014    Past Medical History:  Diagnosis Date  . GERD (gastroesophageal reflux disease)   . Hypertension     Past Surgical History:  Procedure Laterality Date  . KNEE SURGERY      Family History  Problem Relation Age of Onset  . Hypertension Father   . Hearing loss Father   . Heart disease Other   . Migraines Neg Hx     Social History   Socioeconomic History  . Marital status: Married    Spouse name: Not on file  . Number of children: Not on file  . Years of education: Not on file  . Highest education level: Not on file  Occupational History  . Not on file  Tobacco Use  . Smoking status: Former Games developer  . Smokeless tobacco: Never Used  Substance and Sexual Activity  . Alcohol use: Yes    Alcohol/week: 0.0 standard drinks    Comment: 2 drinks daily   . Drug use: No  . Sexual activity: Not on file  Other Topics Concern  . Not on file  Social History Narrative   Patient lives at home with wife, [place] Citizen    Patient has 1 child   Patient has a BS degree   Patient works at [employer]  Resource Strain: Not on file  Food Insecurity: Not on file  Transportation Needs: Not on file  Physical Activity: Not on file  Stress: Not on file  Social Connections: Not on file  Intimate Partner Violence: Not on file    Review of Systems  Respiratory: Positive for apnea. Negative for shortness of breath.   Psychiatric/Behavioral: Positive for sleep disturbance.    Vitals:   10/30/20 1532  BP: 120/82  Pulse: 71  Temp: 97.6 F (36.4 C)  SpO2: 99%     Physical Exam Constitutional:      Appearance: He is obese.  HENT:     Nose: Nose normal.     Mouth/Throat:     Mouth: Mucous membranes are moist.  Eyes:     General:        Right eye: No discharge.        Left eye: No discharge.  Cardiovascular:      Rate and Rhythm: Normal rate and regular rhythm.     Heart sounds: No murmur heard. No friction rub.  Pulmonary:     Effort: No respiratory distress.     Breath sounds: No stridor. No wheezing or rhonchi.  Musculoskeletal:     Cervical back: No rigidity or tenderness.  Neurological:     Mental Status: He is alert.  Psychiatric:        Mood and Affect: Mood normal.   Epworth Sleepiness Scale 6  Data Reviewed: Previous study not available  Assessment:  History of obstructive sleep apnea  Oral device appears to be working well  Hypertension  Pathophysiology of sleep disordered breathing reviewed  Plan/Recommendations:  We will obtain a home sleep study with the oral device in place to ascertain adequate treatment of his sleep disordered breathing with a device in place  Patient has access to a sleep device to his spouse, will try and get this performed  If he is unable to get this performed then we will schedule him for a sleep study through our office  If sleep apnea is well controlled with the device in place,if he is functioning well with the device,if sleep apnea symptoms are well controlled with the device-no changes need to be made  Needs optimal treatment of his blood pressure  Encourage weight loss and dietary changes  Tentative follow-up in 3 to 4 months   Virl Diamond MD [facility] 10/30/2020, 3:57 PM  CC: Roderick Pee, PA

## 2020-10-30 NOTE — Patient Instructions (Signed)
History of obstructive sleep apnea  If your oral device can be confirmed to be working well, no changes need to be made  -You need to have a sleep study done with the oral device in place to ascertain that you are not having obstructive episodes with the device in place   -As we discussed you can have it done by yourself -We will schedule you for one and when we call you , you can let us know that you have had it done   I will see you tentatively in about 3 to 4 months

## 2020-11-12 ENCOUNTER — Telehealth: Payer: Self-pay | Admitting: Pulmonary Disease

## 2020-11-12 NOTE — Telephone Encounter (Signed)
Order was put in on 3/15 for hst and note on order states to ask pt if he has had a sleep study done yet because his wife works at [employer] that does test and wants to try there first.  Spoke to pt today & he states he did have study and wife sent info in to Dr Wynona Neat.

## 2020-11-12 NOTE — Telephone Encounter (Signed)
I do not see the sleep study anywhere  Checked with Dr Wynona Neat and he has not seen it either  Called pt and he states he is not sure if his spouse faxed this or mailed it He is going to talk with her about it tonight and call us tomorrow  Will await his call

## 2020-11-14 NOTE — Telephone Encounter (Signed)
Checked up front and AO's lookat again and still no sleep study on this pt  Called and spoke with the pt  He states that his spouse had faxed this a few days ago, but he was not sure what number she faxed it to  I gave him the up front fax number [phone] and she will refax it today  Will keep an eye out for this

## 2020-11-16 NOTE — Telephone Encounter (Signed)
Found HST downstairs in AO's mail. HST has been placed in AO's look-at folder. Called and spoke with patient to let him know that we have received the document.   AO, can you please advise once you have reviewed his HST? Thanks.

## 2020-11-28 NOTE — Telephone Encounter (Signed)
AO, please advise if you have reviewed his HST. Thanks!

## 2020-12-05 ENCOUNTER — Telehealth: Payer: Self-pay | Admitting: Pulmonary Disease

## 2020-12-05 NOTE — Telephone Encounter (Signed)
Kindly call, inform patient  Sleep study performed 10/30/2020, reviewed.  Revealed mild obstructive sleep apnea with an AHI of 13.1   Options of treatment for mild obstructive sleep apnea will include  1.  CPAP therapy if significant daytime sleepiness -Auto CPAP 5-15 will be acceptable pressures  2.  An oral device fashioned by a dentist will be acceptable option of treatment for mild obstructive sleep apnea  3.  Watchful waiting with focus on aggressive weight loss measures may also be considered if asymptomatic

## 2020-12-05 NOTE — Telephone Encounter (Signed)
Dr. Val Eagle, please advise on HST. Thanks!

## 2020-12-06 NOTE — Telephone Encounter (Signed)
Found message sent on 4/20 from OA. Called and spoke with patient regarding sleep study and Dr. Kary Kos recs. Patient states he has an oral device and it is working for him.  Nothing further needed at this time.

## 2020-12-06 NOTE — Telephone Encounter (Signed)
Called and spoke with patient regarding his sleep test. Advised patient of Dr. Trena Platt recs. Patient states he already has an oral device and it is working for him.  Nothing further needed at this time.

## 2020-12-06 NOTE — Telephone Encounter (Signed)
Did review and sent message to u at 1PM,12/05/20

## 2021-02-28 ENCOUNTER — Encounter: Payer: Self-pay | Admitting: Orthopaedic Surgery

## 2021-02-28 ENCOUNTER — Ambulatory Visit (INDEPENDENT_AMBULATORY_CARE_PROVIDER_SITE_OTHER): Payer: Commercial Managed Care - PPO | Admitting: Orthopaedic Surgery

## 2021-02-28 ENCOUNTER — Ambulatory Visit: Payer: Self-pay

## 2021-02-28 ENCOUNTER — Other Ambulatory Visit: Payer: Self-pay

## 2021-02-28 VITALS — Ht 77.0 in | Wt 280.0 lb

## 2021-02-28 DIAGNOSIS — M25562 Pain in left knee: Secondary | ICD-10-CM

## 2021-02-28 DIAGNOSIS — M1712 Unilateral primary osteoarthritis, left knee: Secondary | ICD-10-CM | POA: Diagnosis not present

## 2021-02-28 DIAGNOSIS — G8929 Other chronic pain: Secondary | ICD-10-CM

## 2021-02-28 DIAGNOSIS — M170 Bilateral primary osteoarthritis of knee: Secondary | ICD-10-CM

## 2021-02-28 MED ORDER — LIDOCAINE HCL 1 % IJ SOLN
2.0000 mL | INTRAMUSCULAR | Status: AC | PRN
  Administered 2021-02-28: 2 mL

## 2021-02-28 MED ORDER — BUPIVACAINE HCL 0.25 % IJ SOLN
2.0000 mL | INTRAMUSCULAR | Status: AC | PRN
  Administered 2021-02-28: 2 mL via INTRA_ARTICULAR

## 2021-02-28 NOTE — Progress Notes (Signed)
Office Visit Note   Patient: Jonathan Lawrence           Date of Birth: August 17, 1971           MRN: [id number] Visit Date: 02/28/2021              Requested by: [facility] [address] [place],  [place] [address] PCP: [facility]   Assessment & Plan: Visit Diagnoses:  1. Chronic pain of left knee   2. Bilateral primary osteoarthritis of knee     Plan: End-stage osteoarthritis left knee.  Long discussion regarding diagnosis and treatment options which are somewhat to the discussion in the past.  I injected the knee with cortisone today.  He is aware that this may or may not provide much relief given the significant thickness of his arthritis.  Discussed his new x-rays and he was happy know that there were not any acute changes.  Takes over-the-counter medicine has a pullover knee support and continues to work on his exercises  Follow-Up Instructions: Return if symptoms worsen or fail to improve.   Orders:  Orders Placed This Encounter  Procedures   Large Joint Inj: L knee   XR KNEE 3 VIEW LEFT   No orders of the defined types were placed in this encounter.     Procedures: Large Joint Inj: L knee on 02/28/2021 4:26 PM Indications: pain and diagnostic evaluation Details: 25 G 1.5 in needle, anteromedial approach  Arthrogram: No  Medications: 2 mL lidocaine 1 %; 2 mL bupivacaine 0.25 %  12 mg betamethasone injected in the medial compartment left knee with Xylocaine and Marcaine Procedure, treatment alternatives, risks and benefits explained, specific risks discussed. Consent was given by the patient. Patient was prepped and draped in the usual sterile fashion.      Clinical Data: No additional findings.   Subjective: Chief Complaint  Patient presents with   Left Knee - Pain  Patient presents today for bilateral knee pain. He said that his left is definitely worse than the right. He has recently been  working out. He has developed pain in the lateral aspect of his left knee. He said that it seems to get better with exercise, but then "locks down" later. He experiences grinding, giving way, and swells. He does not take anything for pain.  Years ago had a left knee arthroscopy for an osteochondral defect that was pinned in place.  Did well postoperatively  HPI  Review of Systems   Objective: Vital Signs: Ht 6\' 5"  (1.956 m)   Wt 280 lb (127 kg)   BMI 33.20 kg/m   Physical Exam Constitutional:      Appearance: He is well-developed.  Eyes:     Pupils: Pupils are equal, round, and reactive to light.  Pulmonary:     Effort: Pulmonary effort is normal.  Skin:    General: Skin is warm and dry.  Neurological:     Mental Status: He is alert and oriented to person, place, and time.  Psychiatric:        Behavior: Behavior normal.    Ortho Exam awake alert and oriented x3.  Comfortable sitting.  Left knee was not hot red warm or swollen.  Slight varus with weightbearing.  No effusion.  No instability.  Full extension and flex to at least 205 degrees.  No opening with varus valgus stress.  No popliteal pain or mass.  No calf pain.  No distal  edema.  Some patella crepitation but no pain with patella compression  Specialty Comments:  No specialty comments available.  Imaging: XR KNEE 3 VIEW LEFT  Result Date: 02/28/2021 Films of the left knee were obtained in 3 projections standing.  There are end-stage osteoarthritic changes with bone-on-bone in the medial compartment associated with peripheral osteophytes and subchondral sclerosis.  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  There is considerable degenerative change the patellofemoral joint as well.    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   Word finding difficulty 06/30/2014   Vision changes 06/30/2014   Cognitive changes 06/30/2014   Past Medical History:  Diagnosis Date   GERD (gastroesophageal reflux disease)    Hypertension     Family History  Problem Relation Age of Onset   Hypertension Father    Hearing loss Father    Heart disease Other    Migraines Neg Hx     Past Surgical History:  Procedure Laterality Date   KNEE SURGERY     Social History   Occupational History   Not on file  Tobacco Use   Smoking status: Former   Smokeless tobacco: Never  Substance and Sexual Activity   Alcohol use: Yes    Alcohol/week: 0.0 standard drinks    Comment: 2 drinks daily    Drug use: No   Sexual activity: Not on file

## 2023-11-17 ENCOUNTER — Ambulatory Visit: Admitting: Orthopaedic Surgery

## 2023-11-17 ENCOUNTER — Other Ambulatory Visit (INDEPENDENT_AMBULATORY_CARE_PROVIDER_SITE_OTHER)

## 2023-11-17 DIAGNOSIS — M25561 Pain in right knee: Secondary | ICD-10-CM | POA: Diagnosis not present

## 2023-11-17 DIAGNOSIS — G8929 Other chronic pain: Secondary | ICD-10-CM

## 2023-11-17 DIAGNOSIS — M25562 Pain in left knee: Secondary | ICD-10-CM

## 2023-11-17 NOTE — Progress Notes (Signed)
 Office Visit Note   Patient: Jonathan Lawrence           Date of Birth: 1970/10/26           MRN: [id number] Visit Date: 11/17/2023              Requested by: [facility] [address] [place],  [place] [address] PCP: [facility]   Assessment & Plan: Visit Diagnoses:  1. Chronic pain of left knee   2. Chronic pain of right knee     Plan: Tilford is a very pleasant 53 year old gentleman with advanced left knee DJD.  He has widespread osteophytic changes.  Could have a chondral loose body in his knee based on history but sounds like this has gotten better.  Could consider an injection but he feels like his symptoms are back to baseline so we will hold off for now.  Will see him back if symptoms return.  Follow-Up Instructions: No follow-ups on file.   Orders:  Orders Placed This Encounter  Procedures   XR KNEE 3 VIEW LEFT   XR KNEE 3 VIEW RIGHT   No orders of the defined types were placed in this encounter.     Procedures: No procedures performed   Clinical Data: No additional findings.   Subjective: Chief Complaint  Patient presents with   Left Knee - Pain    HPI Jonathan Lawrence is a 53 year old gentleman here for evaluation of left knee pain.  He is a previous patient of Dr. Hoy Register.  He has had multiple scopes on the left knee.  Recently he felt a locking sensation in his knee for a few days that got better.  Felt like a loose body sensation. Review of Systems  Constitutional: Negative.   HENT: Negative.    Eyes: Negative.   Respiratory: Negative.    Cardiovascular: Negative.   Gastrointestinal: Negative.   Endocrine: Negative.   Genitourinary: Negative.   Skin: Negative.   Allergic/Immunologic: Negative.   Neurological: Negative.   Hematological: Negative.   Psychiatric/Behavioral: Negative.    All other systems reviewed and are negative.    Objective: Vital Signs: There were no vitals  taken for this visit.  Physical Exam Vitals and nursing note reviewed.  Constitutional:      Appearance: He is well-developed.  HENT:     Head: Normocephalic and atraumatic.  Eyes:     Pupils: Pupils are equal, round, and reactive to light.  Pulmonary:     Effort: Pulmonary effort is normal.  Abdominal:     Palpations: Abdomen is soft.  Musculoskeletal:        General: Normal range of motion.     Cervical back: Neck supple.  Skin:    General: Skin is warm.  Neurological:     Mental Status: He is alert and oriented to person, place, and time.  Psychiatric:        Behavior: Behavior normal.        Thought Content: Thought content normal.        Judgment: Judgment normal.     Ortho Exam Exam of the left knee shows moderate effusion.  Medial joint line tenderness.  Do not feel palpable loose body in the gutters. Specialty Comments:  No specialty comments available.  Imaging: XR KNEE 3 VIEW LEFT Result Date: 11/17/2023 X-rays of the left knee show bone-on-bone joint space narrowing of the medial compartment with osteophytic changes and varus deformity.  XR KNEE  3 VIEW RIGHT Result Date: 11/17/2023 X-rays of the right knee show mild tricompartmental osteoarthritis.  Joint spaces are well-preserved.    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   Word finding difficulty 06/30/2014   Vision changes 06/30/2014   Cognitive changes 06/30/2014   Past Medical History:  Diagnosis Date   GERD (gastroesophageal reflux disease)    Hypertension     Family History  Problem Relation Age of Onset   Hypertension Father    Hearing loss Father    Heart disease Other    Migraines Neg Hx     Past Surgical History:  Procedure Laterality Date   KNEE SURGERY     Social History   Occupational History   Not on file   Tobacco Use   Smoking status: Former   Smokeless tobacco: Never  Substance and Sexual Activity   Alcohol use: Yes    Alcohol/week: 0.0 standard drinks of alcohol    Comment: 2 drinks daily    Drug use: No   Sexual activity: Not on file

## 2024-04-14 ENCOUNTER — Other Ambulatory Visit: Payer: Self-pay

## 2024-04-14 ENCOUNTER — Other Ambulatory Visit (INDEPENDENT_AMBULATORY_CARE_PROVIDER_SITE_OTHER): Payer: Self-pay

## 2024-04-14 ENCOUNTER — Ambulatory Visit: Admitting: Orthopaedic Surgery

## 2024-04-14 DIAGNOSIS — M25561 Pain in right knee: Secondary | ICD-10-CM

## 2024-04-14 DIAGNOSIS — M25562 Pain in left knee: Secondary | ICD-10-CM | POA: Diagnosis not present

## 2024-04-14 DIAGNOSIS — G8929 Other chronic pain: Secondary | ICD-10-CM

## 2024-04-14 DIAGNOSIS — M1712 Unilateral primary osteoarthritis, left knee: Secondary | ICD-10-CM | POA: Diagnosis not present

## 2024-04-14 DIAGNOSIS — M1711 Unilateral primary osteoarthritis, right knee: Secondary | ICD-10-CM

## 2024-04-14 MED ORDER — BUPIVACAINE HCL 0.5 % IJ SOLN
2.0000 mL | INTRAMUSCULAR | Status: AC | PRN
  Administered 2024-04-14: 2 mL via INTRA_ARTICULAR

## 2024-04-14 MED ORDER — METHYLPREDNISOLONE ACETATE 40 MG/ML IJ SUSP
40.0000 mg | INTRAMUSCULAR | Status: AC | PRN
  Administered 2024-04-14: 40 mg via INTRA_ARTICULAR

## 2024-04-14 MED ORDER — LIDOCAINE HCL 1 % IJ SOLN
2.0000 mL | INTRAMUSCULAR | Status: AC | PRN
  Administered 2024-04-14: 2 mL

## 2024-04-14 NOTE — Progress Notes (Signed)
 Office Visit Note   Patient: Jonathan Lawrence           Date of Birth: [birth year]           MRN: [id number] Visit Date: 04/14/2024              Requested by: [facility] [address]  [place] [address] PCP: [facility]   Assessment & Plan: Visit Diagnoses:  1. Primary osteoarthritis of right knee   2. Primary osteoarthritis of left knee     Plan: History of Present Illness Jonathan Lawrence is a 53 year old male who follows up for bilateral knee osteoarthritis  He experiences worsening right knee pain and instability, with a recent incident where the knee gave out completely while coming [place] the beach. This caused significant pain radiating down through the calf, and he was unable to walk on it for three days. Symptoms of locking and popping persist, particularly after prolonged sitting, described as sounding like 'Rice Krispies treats'.  Also requiring manual straightening.   His right knee has a history of a bone fragment detachment during college, requiring surgical reattachment. A persistent lump remains at the site of the previous repair, performed in 1995.  Assessment and Plan Right knee pain with locking and possible meniscus tear Right knee pain with locking, likely meniscus tear. X-rays normal, further imaging needed. - Order MRI of the right knee to evaluate for meniscus tear or loose body.  Left knee end-stage osteoarthritis with pain and locking Left knee end-stage osteoarthritis with bone-on-bone contact and significant bone spurs. Severe condition may require knee replacement. Cortisone injection considered for relief. - Administer cortisone injection to the left knee for symptomatic relief.  Follow-Up Instructions: No follow-ups on file.   Orders:  Orders Placed This Encounter  Procedures   Large Joint Inj   XR KNEE 3 VIEW RIGHT   XR KNEE 3 VIEW LEFT   No orders of the defined types  were placed in this encounter.     Procedures: Large Joint Inj: L knee on 04/14/2024 6:03 PM Details: 22 G needle Medications: 2 mL bupivacaine  0.5 %; 2 mL lidocaine  1 %; 40 mg methylPREDNISolone  acetate 40 MG/ML Outcome: tolerated well, no immediate complications Patient was prepped and draped in the usual sterile fashion.       Clinical Data: No additional findings.   Subjective: Chief Complaint  Patient presents with   Right Knee - Pain   Left Knee - Pain    HPI  Review of Systems  Constitutional: Negative.   HENT: Negative.    Eyes: Negative.   Respiratory: Negative.    Cardiovascular: Negative.   Gastrointestinal: Negative.   Endocrine: Negative.   Genitourinary: Negative.   Skin: Negative.   Allergic/Immunologic: Negative.   Neurological: Negative.   Hematological: Negative.   Psychiatric/Behavioral: Negative.    All other systems reviewed and are negative.    Objective: Vital Signs: There were no vitals taken for this visit.  Physical Exam Vitals and nursing note reviewed.  Constitutional:      Appearance: He is well-developed.  HENT:     Head: Normocephalic and atraumatic.  Eyes:     Pupils: Pupils are equal, round, and reactive to light.  Pulmonary:     Effort: Pulmonary effort is normal.  Abdominal:     Palpations: Abdomen is soft.  Musculoskeletal:        General: Normal range of motion.  Cervical back: Neck supple.  Skin:    General: Skin is warm.  Neurological:     Mental Status: He is alert and oriented to person, place, and time.  Psychiatric:        Behavior: Behavior normal.        Thought Content: Thought content normal.        Judgment: Judgment normal.     Ortho Exam  Specialty Comments:  No specialty comments available.  Imaging: XR KNEE 3 VIEW RIGHT Result Date: 04/14/2024 X-rays of the right knee show moderate tricompartment osteoarthritis  XR KNEE 3 VIEW LEFT Result Date: 04/14/2024 X-rays of the left knee  show advanced tricompartmental osteoarthritis.  Bone-on-bone joint space narrowing.  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   Word finding difficulty 06/30/2014   Vision changes 06/30/2014   Cognitive changes 06/30/2014   Past Medical History:  Diagnosis Date   GERD (gastroesophageal reflux disease)    Hypertension     Family History  Problem Relation Age of Onset   Hypertension Father    Hearing loss Father    Heart disease Other    Migraines Neg Hx     Past Surgical History:  Procedure Laterality Date   KNEE SURGERY     Social History   Occupational History   Not on file  Tobacco Use   Smoking status: Former   Smokeless tobacco: Never  Substance and Sexual Activity   Alcohol use: Yes    Alcohol/week: 0.0 standard drinks of alcohol    Comment: 2 drinks daily    Drug use: No   Sexual activity: Not on file

## 2024-04-16 ENCOUNTER — Ambulatory Visit
Admission: RE | Admit: 2024-04-16 | Discharge: 2024-04-16 | Disposition: A | Discharge status: Home / Self Care | Source: Ambulatory Visit | Attending: Orthopaedic Surgery | Admitting: Orthopaedic Surgery

## 2024-04-16 DIAGNOSIS — G8929 Other chronic pain: Secondary | ICD-10-CM

## 2024-04-26 ENCOUNTER — Ambulatory Visit: Admitting: Orthopaedic Surgery

## 2024-04-26 DIAGNOSIS — M1711 Unilateral primary osteoarthritis, right knee: Secondary | ICD-10-CM | POA: Diagnosis not present

## 2024-04-26 DIAGNOSIS — S83241A Other tear of medial meniscus, current injury, right knee, initial encounter: Secondary | ICD-10-CM | POA: Diagnosis not present

## 2024-04-26 MED ORDER — BUPIVACAINE HCL 0.5 % IJ SOLN
2.0000 mL | INTRAMUSCULAR | Status: AC | PRN
  Administered 2024-04-26: 2 mL via INTRA_ARTICULAR

## 2024-04-26 MED ORDER — LIDOCAINE HCL 1 % IJ SOLN
2.0000 mL | INTRAMUSCULAR | Status: AC | PRN
  Administered 2024-04-26: 2 mL

## 2024-04-26 MED ORDER — METHYLPREDNISOLONE ACETATE 40 MG/ML IJ SUSP
40.0000 mg | INTRAMUSCULAR | Status: AC | PRN
  Administered 2024-04-26: 40 mg via INTRA_ARTICULAR

## 2024-04-26 NOTE — Progress Notes (Signed)
 Office Visit Note   Patient: Jonathan Lawrence           Date of Birth: May 18, 1971           MRN: [id number] Visit Date: 04/26/2024              Requested by: [facility] [address]  [place] [address] PCP: [facility]   Assessment & Plan: Visit Diagnoses:  1. Acute medial meniscus tear of right knee, initial encounter   2. Primary osteoarthritis of right knee     Plan: History of Present Illness Jonathan Lawrence is a 53 year old male who returns today to discuss MRI scan.  He experiences persistent knee weakness, describing it as feeling like 'walking on a bubble.' The knee is constantly sore, particularly on the side with fluid accumulation. He uses a brace for support, which provides relief.  An MRI reveals a tear in the posterior horn of the medial meniscus, arthritis with bone spurs, and a deep bone bruise. Fluid in the knee causes discomfort and a 'boggy' sensation.  Yard work without the brace exacerbates the knee pain.  Results RADIOLOGY Knee MRI: Tear in the posterior horn of the medial meniscus, osteophytes, bone marrow edema medial femoral condyle, chondral irregularity with a defect, and joint effusion.    Assessment and Plan Right knee medial meniscus tear with osteoarthritis, bone marrow edema, and joint effusion MRI shows a posterior horn tear of the medial meniscus with significant osteoarthritis, bone marrow edema, and joint effusion. Bone marrow edema likely the cause of the majority of the symptoms. Joint effusion causes discomfort. Cortisone injection considered beneficial for symptom relief.. - Administer cortisone injection to the right knee for inflammation and symptom relief. - Recommend low-impact exercises: recumbent or upright bike, aquatic activities. - Advise against high-impact activities: running, excessive walking.  Follow-Up Instructions: No follow-ups on file.   Orders:   No orders of the defined types were placed in this encounter.  No orders of the defined types were placed in this encounter.     Procedures: Large Joint Inj: R knee on 04/26/2024 8:42 AM Indications: pain Details: 22 G needle  Arthrogram: No  Medications: 40 mg methylPREDNISolone  acetate 40 MG/ML; 2 mL lidocaine  1 %; 2 mL bupivacaine  0.5 % Consent was given by the patient. Patient was prepped and draped in the usual sterile fashion.       Clinical Data: No additional findings.   Subjective: Chief Complaint  Patient presents with   Right Knee - Follow-up    MRI review    HPI  Review of Systems  Constitutional: Negative.   HENT: Negative.    Eyes: Negative.   Respiratory: Negative.    Cardiovascular: Negative.   Gastrointestinal: Negative.   Endocrine: Negative.   Genitourinary: Negative.   Skin: Negative.   Allergic/Immunologic: Negative.   Neurological: Negative.   Hematological: Negative.   Psychiatric/Behavioral: Negative.    All other systems reviewed and are negative.    Objective: Vital Signs: There were no vitals taken for this visit.  Physical Exam Vitals and nursing note reviewed.  Constitutional:      Appearance: He is well-developed.  HENT:     Head: Normocephalic and atraumatic.  Eyes:     Pupils: Pupils are equal, round, and reactive to light.  Pulmonary:     Effort: Pulmonary effort is normal.  Abdominal:     Palpations: Abdomen is soft.  Musculoskeletal:  General: Normal range of motion.     Cervical back: Neck supple.  Skin:    General: Skin is warm.  Neurological:     Mental Status: He is alert and oriented to person, place, and time.  Psychiatric:        Behavior: Behavior normal.        Thought Content: Thought content normal.        Judgment: Judgment normal.     Ortho Exam  Specialty Comments:  No specialty comments available.  Imaging: No results found.   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   Word finding difficulty 06/30/2014   Vision changes 06/30/2014   Cognitive changes 06/30/2014   Past Medical History:  Diagnosis Date   GERD (gastroesophageal reflux disease)    Hypertension     Family History  Problem Relation Age of Onset   Hypertension Father    Hearing loss Father    Heart disease Other    Migraines Neg Hx     Past Surgical History:  Procedure Laterality Date   KNEE SURGERY     Social History   Occupational History   Not on file  Tobacco Use   Smoking status: Former   Smokeless tobacco: Never  Substance and Sexual Activity   Alcohol use: Yes    Alcohol/week: 0.0 standard drinks of alcohol    Comment: 2 drinks daily    Drug use: No   Sexual activity: Not on file

## 2024-05-10 ENCOUNTER — Telehealth: Payer: Self-pay | Admitting: Orthopaedic Surgery

## 2024-05-10 NOTE — Telephone Encounter (Signed)
 Patient called. Says the shot did not help. He is still in pain.

## 2024-05-10 NOTE — Telephone Encounter (Signed)
 He should follow up to talk about options

## 2024-05-10 NOTE — Telephone Encounter (Signed)
Called and scheduled follow up.

## 2024-05-24 ENCOUNTER — Ambulatory Visit: Admitting: Orthopaedic Surgery

## 2024-05-24 DIAGNOSIS — S83241A Other tear of medial meniscus, current injury, right knee, initial encounter: Secondary | ICD-10-CM | POA: Diagnosis not present

## 2024-05-24 NOTE — Progress Notes (Signed)
 Office Visit Note   Patient: Jonathan Lawrence           Date of Birth: 1971/06/22           MRN: [id number] Visit Date: 05/24/2024              Requested by: [facility] [address]  [place] [address] PCP: [facility]   Assessment & Plan: Visit Diagnoses:  1. Acute medial meniscus tear of right knee, initial encounter     Plan: History of Present Illness Jonathan Lawrence is a 53 year old male who follows up for ongoing right knee pain.    He experiences persistent knee pain and swelling despite a recent cortisone injection, which provided relief for only a few hours. The pain is primarily located on the bone and worsens with prolonged standing or walking, requiring frequent sitting breaks. He uses a brace to manage symptoms and is cautious with Motrin due to gastrointestinal side effects.  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  Right knee exam shows joint effusion and medial joint line tenderness.  Pain with ROM.  Assessment and Plan Right knee meniscus tear with chondromalacia Previous cortisone injection minimally effective. Knee replacement too aggressive currently. Significant pain limits mobility. - Recommend knee arthroscopy to debride MMT.  Detailed surgical plan discussed. - Schedule surgery for November to align with work schedule and cortisone clearance. - Coordinate with surgery scheduler for procedure arrangement and insurance authorization.  Follow-Up Instructions: No follow-ups on file.   Orders:  No orders of the defined types were placed in this encounter.  No orders of the defined types were placed in this encounter.     Subjective: Chief Complaint  Patient presents with   Right Knee - Follow-up     HPI  Review of Systems  Constitutional: Negative.   HENT: Negative.    Eyes: Negative.   Respiratory: Negative.    Cardiovascular: Negative.   Gastrointestinal: Negative.   Endocrine: Negative.   Genitourinary: Negative.   Skin: Negative.   Allergic/Immunologic: Negative.   Neurological: Negative.   Hematological: Negative.   Psychiatric/Behavioral: Negative.    All other systems reviewed and are negative.    Objective: Vital Signs: There were no vitals taken for this visit.  Physical Exam Vitals and nursing note reviewed.  Constitutional:      Appearance: He is well-developed.  HENT:     Head: Normocephalic and atraumatic.  Eyes:     Pupils: Pupils are equal, round, and reactive to light.  Pulmonary:     Effort: Pulmonary effort is normal.  Abdominal:     Palpations: Abdomen is soft.  Musculoskeletal:        General: Normal range of motion.     Cervical back: Neck supple.  Skin:    General: Skin is warm.  Neurological:     Mental Status: He is alert and oriented to person, place, and time.  Psychiatric:        Behavior: Behavior normal.        Thought Content: Thought content normal.        Judgment: Judgment normal.     Ortho Exam  Specialty Comments:  No specialty comments available.  Imaging: No results found.   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0 standard drinks of alcohol    Comment: 2 drinks daily    Drug use: No   Sexual activity: Not on file

## 2024-06-20 ENCOUNTER — Encounter: Payer: Self-pay | Admitting: Radiology

## 2024-06-23 ENCOUNTER — Other Ambulatory Visit: Payer: Self-pay | Admitting: Physician Assistant

## 2024-06-23 MED ORDER — TRAMADOL HCL 50 MG PO TABS: 50.0000 mg | ORAL_TABLET | Freq: Three times a day (TID) | ORAL | 2 refills | Status: AC | PRN

## 2024-06-23 MED ORDER — ONDANSETRON HCL 4 MG PO TABS: 4.0000 mg | ORAL_TABLET | Freq: Three times a day (TID) | ORAL | 0 refills | Status: AC | PRN

## 2024-06-23 MED ORDER — TRAMADOL HCL 50 MG PO TABS: 50.0000 mg | ORAL_TABLET | Freq: Three times a day (TID) | ORAL | 2 refills | Status: DC | PRN

## 2024-07-04 ENCOUNTER — Other Ambulatory Visit: Payer: Self-pay | Admitting: Physician Assistant

## 2024-07-07 ENCOUNTER — Encounter: Payer: Self-pay | Admitting: Orthopaedic Surgery

## 2024-07-07 ENCOUNTER — Other Ambulatory Visit: Payer: Self-pay | Admitting: Physician Assistant

## 2024-07-07 DIAGNOSIS — M94261 Chondromalacia, right knee: Secondary | ICD-10-CM | POA: Diagnosis not present

## 2024-07-07 DIAGNOSIS — S83241A Other tear of medial meniscus, current injury, right knee, initial encounter: Secondary | ICD-10-CM | POA: Diagnosis not present

## 2024-07-08 ENCOUNTER — Telehealth: Payer: Self-pay | Admitting: Orthopaedic Surgery

## 2024-07-08 NOTE — Telephone Encounter (Signed)
 Pt called saying that he had a procedure done yesterday afternoon and needs a Dr's note for work. Call back number is [phone].

## 2024-07-08 NOTE — Telephone Encounter (Signed)
 Spoke with patient. Work note created and sent to his MyChart, and email as requested. [email or web address]

## 2024-07-15 ENCOUNTER — Encounter: Payer: Self-pay | Admitting: Orthopaedic Surgery

## 2024-07-19 ENCOUNTER — Ambulatory Visit: Admitting: Orthopaedic Surgery

## 2024-07-19 DIAGNOSIS — S83241A Other tear of medial meniscus, current injury, right knee, initial encounter: Secondary | ICD-10-CM

## 2024-07-19 NOTE — Progress Notes (Signed)
   Post-Op Visit Note   Patient: Jonathan Lawrence           Date of Birth: February 26, 1971           MRN: [id number] Visit Date: 07/19/2024 P[facility]: [facility]   Assessment & Plan:  Chief Complaint:  Chief Complaint  Patient presents with   Right Knee - Follow-up    Right knee scope 07/07/2024   Visit Diagnoses:  1. Acute medial meniscus tear of right knee, initial encounter     Plan: History of Present Illness DAMANY EASTMAN is a 53 year old male who presents for a follow-up status post right knee scope.  He has a persistent rash on his leg that has improved with hydrocortisone cream.  Rash is resolving and likely due to the Betadine.  He is almost two weeks post-surgery and is gradually increasing activity. He plans to start a recumbent bike with low resistance to improve knee range of motion.  He has returned to primarily seated work and uses a brace to limit knee flexion. He uses walking sticks for balance and support. He is working part-time and increasing hours as tolerated, with flexibility to work from home.  He feels generally well but notes weather-related symptom variability. He had bilateral knee soreness that woke him last night, though pain is much improved compared with preoperative levels.  Physical Exam MUSCULOSKELETAL: Knee incision is well-healed.  Range of motion is progressing nicely. SKIN: Rash present, possibly due to adhesive or DuraPrep.  Results Procedure: Suture removal Description: Sutures were removed from the incision site, and Steri-Strips were applied across the incision.  Assessment and Plan Right knee medial meniscus tear, post-operative Post-operative status following debridement. Soreness present but improving. Brace and walking sticks used. - Removed sutures, applied steri-strips. - Advised showering without covering incision. - Instructed use of recumbent bike with low resistance. - Scheduled follow-up in four  weeks.  Follow-Up Instructions: Return in about 4 weeks (around 08/16/2024).   Orders:  No orders of the defined types were placed in this encounter.  No orders of the defined types were placed in this encounter.   Imaging: No results found.  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   Word finding difficulty 06/30/2014   Vision changes 06/30/2014   Cognitive changes 06/30/2014   Past Medical History:  Diagnosis Date   GERD (gastroesophageal reflux disease)    Hypertension     Family History  Problem Relation Age of Onset   Hypertension Father    Hearing loss Father    Heart disease Other    Migraines Neg Hx     Past Surgical History:  Procedure Laterality Date   KNEE SURGERY     Social History   Occupational History   Not on file  Tobacco Use   Smoking status: Former   Smokeless tobacco: Never  Substance and Sexual Activity   Alcohol use: Yes    Alcohol/week: 0.0 standard drinks of alcohol    Comment: 2 drinks daily    Drug use: No   Sexual activity: Not on file

## 2024-08-25 ENCOUNTER — Encounter: Payer: Self-pay | Admitting: Orthopaedic Surgery

## 2024-08-25 ENCOUNTER — Ambulatory Visit (INDEPENDENT_AMBULATORY_CARE_PROVIDER_SITE_OTHER): Admitting: Orthopaedic Surgery

## 2024-08-25 DIAGNOSIS — M1711 Unilateral primary osteoarthritis, right knee: Secondary | ICD-10-CM

## 2024-08-25 DIAGNOSIS — S83241A Other tear of medial meniscus, current injury, right knee, initial encounter: Secondary | ICD-10-CM

## 2024-08-25 NOTE — Progress Notes (Signed)
" ° °  Post-Op Visit Note   Patient: Jonathan Lawrence           Date of Birth: 01-12-1971           MRN: [id number] Visit Date: 08/25/2024 [facility]: [facility]   Assessment & Plan:  Chief Complaint:  Chief Complaint  Patient presents with   Right Knee - Follow-up    Right knee scope 07/07/2024   Visit Diagnoses:  1. Acute medial meniscus tear of right knee, initial encounter   2. Primary osteoarthritis of right knee     Plan: History of Present Illness Jonathan Lawrence is a 54 year old male with right knee osteoarthritis status post right knee arthroscopy who presents for routine postoperative evaluation.  He reports overall improvement after recent meniscectomy but has persistent localized pain and soreness at the meniscal site, worse with prolonged standing and stair ascent, while ambulation is generally well tolerated.  He notes significant right lower extremity weakness and is resuming gym activity, including stationary biking and weight loss efforts.  He is not using anti-inflammatory medications and denies systemic symptoms.  Physical Exam MUSCULOSKELETAL: Residual effusion in the knee. Knee scars are well-healed.  Range of motion is progressing very well.  Assessment and Plan 5 weeks status post right knee arthroscopy In expected postoperative recovery phase. Surgical wounds well-healed. Residual soreness and muscle weakness noted, consistent with normal recovery. No complications identified. - Advised gradual return to activity with emphasis on muscle strengthening and low-impact exercises such as biking. - Provided anticipatory guidance: return to baseline ambulation by 6 weeks, higher-level activities by 12 weeks postoperatively. - No routine follow-up required unless new symptoms develop.  Right knee effusion Mild residual effusion post-arthroscopy, likely due to postoperative inflammation and osteoarthritis. Not currently symptomatic or  harmful. - Recommended observation as effusion may resolve spontaneously. - Advised monitoring for increased swelling or discomfort and to contact office if aspiration and steroid injection are desired.  Follow-Up Instructions: Return if symptoms worsen or fail to improve.   Orders:  No orders of the defined types were placed in this encounter.  No orders of the defined types were placed in this encounter.   Imaging: No results found.  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   Word finding difficulty 06/30/2014   Vision changes 06/30/2014   Cognitive changes 06/30/2014   Past Medical History:  Diagnosis Date   GERD (gastroesophageal reflux disease)    Hypertension     Family History  Problem Relation Age of Onset   Hypertension Father    Hearing loss Father    Heart disease Other    Migraines Neg Hx     Past Surgical History:  Procedure Laterality Date   KNEE SURGERY     Social History   Occupational History   Not on file  Tobacco Use   Smoking status: Former   Smokeless tobacco: Never  Substance and Sexual Activity   Alcohol use: Yes    Alcohol/week: 0.0 standard drinks of alcohol    Comment: 2 drinks daily    Drug use: No   Sexual activity: Not on file     "

## 2024-09-06 ENCOUNTER — Telehealth: Payer: Self-pay | Admitting: Orthopaedic Surgery

## 2024-09-06 NOTE — Telephone Encounter (Signed)
 Created letter with information and sent to patient's MyChart account.

## 2024-09-06 NOTE — Telephone Encounter (Signed)
 Tried to call. Patient states he will call back.

## 2024-09-06 NOTE — Telephone Encounter (Signed)
 Pt's wife called saying that his insurance wants the date of his surgery, the Dr who did the surgery, and the diagnosis.Call back number is [phone].

## 2024-09-06 NOTE — Telephone Encounter (Signed)
" °  Right knee scope 07/07/2024    Visit Diagnoses:  1. Acute medial meniscus tear of right knee, initial encounter   2. Primary osteoarthritis of right knee     "

## 2024-09-07 ENCOUNTER — Encounter: Payer: Self-pay | Admitting: Orthopaedic Surgery
